# Patient Record
Sex: Male | Born: 1985 | Hispanic: Yes | Marital: Single | Smoking: Never smoker
Health system: Southern US, Community
[De-identification: ages and names within clinical notes are randomized; demographics above are authoritative.]

## PROBLEM LIST (undated history)

## (undated) HISTORY — PX: FOOT SURGERY: SHX648

---

## 2014-07-08 ENCOUNTER — Emergency Department: Payer: Self-pay | Admitting: Emergency Medicine

## 2014-07-08 LAB — URINALYSIS, COMPLETE
Bacteria: NONE SEEN
Bilirubin,UR: NEGATIVE
Blood: NEGATIVE
Glucose,UR: NEGATIVE mg/dL
Ketone: NEGATIVE
Leukocyte Esterase: NEGATIVE
Nitrite: NEGATIVE
Ph: 6
Protein: NEGATIVE
RBC,UR: 2 /HPF
Specific Gravity: 1.018
Squamous Epithelial: NONE SEEN
WBC UR: 1 /HPF

## 2016-12-07 ENCOUNTER — Encounter: Payer: Self-pay | Admitting: Emergency Medicine

## 2016-12-07 ENCOUNTER — Emergency Department
Admission: EM | Admit: 2016-12-07 | Discharge: 2016-12-07 | Disposition: A | Discharge status: Home / Self Care | Payer: 59 | Attending: Emergency Medicine | Admitting: Emergency Medicine

## 2016-12-07 DIAGNOSIS — G8929 Other chronic pain: Secondary | ICD-10-CM | POA: Diagnosis not present

## 2016-12-07 DIAGNOSIS — M545 Low back pain: Secondary | ICD-10-CM | POA: Insufficient documentation

## 2016-12-07 DIAGNOSIS — L245 Irritant contact dermatitis due to other chemical products: Secondary | ICD-10-CM | POA: Diagnosis not present

## 2016-12-07 DIAGNOSIS — R21 Rash and other nonspecific skin eruption: Secondary | ICD-10-CM | POA: Diagnosis present

## 2016-12-07 MED ORDER — CALAMINE EX LOTN: 1.0000 "application " | TOPICAL_LOTION | CUTANEOUS | 0 refills | Status: AC | PRN

## 2016-12-07 MED ORDER — IBUPROFEN 800 MG PO TABS: 800.0000 mg | ORAL_TABLET | Freq: Three times a day (TID) | ORAL | 0 refills | Status: DC | PRN

## 2016-12-07 MED ORDER — LORATADINE 10 MG PO TABS: 10.0000 mg | ORAL_TABLET | Freq: Every day | ORAL | 0 refills | Status: AC

## 2016-12-07 NOTE — ED Provider Notes (Signed)
[facility] Emergency Department Provider Note  ____________________________________________  Time seen: Approximately 11:41 AM  I have reviewed the triage vital signs and the nursing notes.   HISTORY  Chief Complaint Rash    HPI Edward Hensley is a 31 y.o. male that presents to the emergency department with a rash over his forearms and low back pain. Patient states that rash began after starting to use a new cleaning solution for floors at work. Patient states that he wears wrist length gloves. Cleaning solution hits the rest of forearm when he submerges hands in cleaning bucket.Patient states that other people at work have similar skin symptoms. Rash itches. He has taken benadryl but it makes him tired. Patient states that he also has low back pain in the center. Pain does not radiate and  is sharp. Patient has seen primary care for this before, who has given him narcotics. Patient states that they told him they could not continue narcotics. They did an x-ray, which did not show anything. No recent trauma. He was recommended to see a chiropractor regularly and has an appointment next week. Patient denies fever, SOB, CP, nausea, vomiting, bowel or bladder incontinence, saddle parathesias, numbness, tingling.   History reviewed. No pertinent past medical history.  There are no active problems to display for this patient.   History reviewed. No pertinent surgical history.  Prior to Admission medications   Medication Sig Start Date End Date Taking? Authorizing Provider  calamine lotion Apply 1 application topically as needed for itching. 12/07/16   Enid DerryAshley Shin Lamour, PA-C  ibuprofen (ADVIL,MOTRIN) 800 MG tablet Take 1 tablet (800 mg total) by mouth every 8 (eight) hours as needed. 12/07/16   Enid DerryAshley Eldon Zietlow, PA-C  loratadine (CLARITIN) 10 MG tablet Take 1 tablet (10 mg total) by mouth daily. 12/07/16 12/07/17  Enid DerryAshley Sukhraj Esquivias, PA-C    Allergies Patient has no known  allergies.  History reviewed. No pertinent family history.  Social History Social History  Substance Use Topics  . Smoking status: Never Smoker  . Smokeless tobacco: Never Used  . Alcohol use Yes     Review of Systems  Constitutional: No fever/chills ENT: No upper respiratory complaints. Cardiovascular: No chest pain. Respiratory: No cough. No SOB. Gastrointestinal: No abdominal pain.  No nausea, no vomiting.  Skin: Negative for rash, abrasions, lacerations, ecchymosis. Neurological: Negative for headaches, numbness or tingling   ____________________________________________   PHYSICAL EXAM:  VITAL SIGNS: ED Triage Vitals  Enc Vitals Group     BP 12/07/16 1112 124/77     Pulse Rate 12/07/16 1112 71     Resp 12/07/16 1112 16     Temp 12/07/16 1112 98.4 F (36.9 C)     Temp Source 12/07/16 1112 Oral     SpO2 12/07/16 1112 99 %     Weight 12/07/16 1108 170 lb (77.1 kg)     Height --      Head Circumference --      Peak Flow --      Pain Score --      Pain Loc --      Pain Edu? --      Excl. in GC? --     Eyes: Conjunctivae are normal. PERRL. EOMI. Head: Atraumatic. ENT:      Ears:      Nose: No congestion/rhinnorhea.      Mouth/Throat: Mucous membranes are moist.  Neck: No stridor.   Cardiovascular: Normal rate, regular rhythm.  Good peripheral circulation. Respiratory: Normal  respiratory effort without tachypnea or retractions. Lungs CTAB. Good air entry to the bases with no decreased or absent breath sounds. Musculoskeletal: Full range of motion to all extremities. No gross deformities appreciated. No tenderness to palpation over lumbar spine or SI joints. Neurologic:  Normal speech and language. No gross focal neurologic deficits are appreciated.  Skin:  Skin is warm, dry and intact. Dry, scaling skin over forearms bilaterally   ____________________________________________   LABS (all labs ordered are listed, but only abnormal results are  displayed)  Labs Reviewed - No data to display ____________________________________________  EKG   ____________________________________________  RADIOLOGY  No results found.  ____________________________________________    PROCEDURES  Procedure(s) performed:    Procedures    Medications - No data to display   ____________________________________________   INITIAL IMPRESSION / ASSESSMENT AND PLAN / ED COURSE  Pertinent labs & imaging results that were available during my care of the patient were reviewed by me and considered in my medical decision making (see chart for details).  Review of the [place] CSRS was performed in accordance of the [facility] prior to dispensing any controlled drugs.   Patient's diagnosis is consistent with irritant contact dermatitis and low back pain. Vital signs and exam are reassuring. Patient was given work note stating that he needs elbow length gloves and eye protection. Patient should apply calamine lotion to forearms and avoid exposure to floor cleaner. Patient should continue seeing chiropractor for low back pain and can take ibuprofen 3 times a day as needed. Patient that I will not prescribe narcotics. Extensive education was provided. Patient will be discharged home with prescriptions for calamine lotion, ibuprofen, claritin. Patient is to follow up with PCP as directed. Patient is given ED precautions to return to the ED for any worsening or new symptoms.     ____________________________________________  FINAL CLINICAL IMPRESSION(S) / ED DIAGNOSES  Final diagnoses:  Irritant contact dermatitis due to other chemical products  Chronic midline low back pain without sciatica      NEW MEDICATIONS STARTED DURING THIS VISIT:  Discharge Medication List as of 12/07/2016 11:49 AM    START taking these medications   Details  calamine lotion Apply 1 application topically as needed for itching., Starting Thu 12/07/2016, Print    ibuprofen  (ADVIL,MOTRIN) 800 MG tablet Take 1 tablet (800 mg total) by mouth every 8 (eight) hours as needed., Starting Thu 12/07/2016, Print    loratadine (CLARITIN) 10 MG tablet Take 1 tablet (10 mg total) by mouth daily., Starting Thu 12/07/2016, Until Fri 12/07/2017, Print            This chart was dictated using voice recognition software/Dragon. Despite best efforts to proofread, errors can occur which can change the meaning. Any change was purely unintentional.    Enid Derry, PA-C 12/07/16 1312    Arnaldo Natal, MD 12/07/16 [phone]

## 2016-12-07 NOTE — ED Notes (Signed)
States arm dryness for several weeks, pt states dryness began after he began using a new cleaner at work, states he wears short gloves and irritation is noticed from wrists down, awake and alert, interpreter at bedside

## 2016-12-07 NOTE — ED Triage Notes (Addendum)
Pt c/o rash X 1 month to arms.  Was to whole body but now only arms. Appears to have dry skin to arms. It itches per pt. NAD. Respirations unlabored. No fevers. He thinks from an additive at work used to wash hands.

## 2018-01-09 ENCOUNTER — Encounter: Payer: Self-pay | Admitting: Emergency Medicine

## 2018-01-09 ENCOUNTER — Emergency Department: Payer: 59

## 2018-01-09 ENCOUNTER — Other Ambulatory Visit: Payer: Self-pay

## 2018-01-09 ENCOUNTER — Emergency Department
Admission: EM | Admit: 2018-01-09 | Discharge: 2018-01-09 | Disposition: A | Discharge status: Home / Self Care | Payer: 59 | Attending: Emergency Medicine | Admitting: Emergency Medicine

## 2018-01-09 DIAGNOSIS — S40012A Contusion of left shoulder, initial encounter: Secondary | ICD-10-CM | POA: Diagnosis not present

## 2018-01-09 DIAGNOSIS — M25572 Pain in left ankle and joints of left foot: Secondary | ICD-10-CM | POA: Insufficient documentation

## 2018-01-09 DIAGNOSIS — Y999 Unspecified external cause status: Secondary | ICD-10-CM | POA: Diagnosis not present

## 2018-01-09 DIAGNOSIS — Z79899 Other long term (current) drug therapy: Secondary | ICD-10-CM | POA: Insufficient documentation

## 2018-01-09 DIAGNOSIS — Y939 Activity, unspecified: Secondary | ICD-10-CM | POA: Diagnosis not present

## 2018-01-09 DIAGNOSIS — Y929 Unspecified place or not applicable: Secondary | ICD-10-CM | POA: Diagnosis not present

## 2018-01-09 DIAGNOSIS — S299XXA Unspecified injury of thorax, initial encounter: Secondary | ICD-10-CM | POA: Diagnosis present

## 2018-01-09 DIAGNOSIS — S20212A Contusion of left front wall of thorax, initial encounter: Secondary | ICD-10-CM | POA: Diagnosis not present

## 2018-01-09 MED ORDER — CYCLOBENZAPRINE HCL 5 MG PO TABS: 5.0000 mg | ORAL_TABLET | Freq: Three times a day (TID) | ORAL | 0 refills | Status: AC | PRN

## 2018-01-09 MED ORDER — IBUPROFEN 800 MG PO TABS
800.0000 mg | ORAL_TABLET | Freq: Once | ORAL | Status: AC
  Administered 2018-01-09: 800 mg via ORAL
  Filled 2018-01-09: qty 1

## 2018-01-09 MED ORDER — IBUPROFEN 800 MG PO TABS
800.0000 mg | ORAL_TABLET | Freq: Three times a day (TID) | ORAL | 0 refills | Status: AC | PRN
Start: 2018-01-09 — End: ?

## 2018-01-09 MED ORDER — CYCLOBENZAPRINE HCL 10 MG PO TABS
10.0000 mg | ORAL_TABLET | Freq: Once | ORAL | Status: AC
  Administered 2018-01-09: 10 mg via ORAL
  Filled 2018-01-09: qty 1

## 2018-01-09 NOTE — ED Notes (Signed)
Pt returned from xray at this time.
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
## 2018-01-09 NOTE — Discharge Instructions (Signed)
Please rest ice and elevate the ankle shoulder and ribs.  Take Tylenol, ibuprofen and Flexeril as needed for pain.  Follow with orthopedics if no improvement in 1 week.
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
## 2018-01-09 NOTE — ED Notes (Signed)
Pt ambulatory to POV, VSS, NAD. Discharge instructions, RX and follow up discussed with pt with interpreter.

## 2018-01-09 NOTE — ED Notes (Signed)
Patient transported to X-ray 

## 2018-01-21 ENCOUNTER — Ambulatory Visit
Admission: RE | Admit: 2018-01-21 | Discharge: 2018-01-21 | Disposition: A | Discharge status: Home / Self Care | Payer: 59 | Source: Ambulatory Visit | Attending: Family Medicine | Admitting: Family Medicine

## 2018-01-21 ENCOUNTER — Other Ambulatory Visit: Payer: Self-pay | Admitting: Chiropractor

## 2018-01-21 ENCOUNTER — Ambulatory Visit
Admission: RE | Admit: 2018-01-21 | Discharge: 2018-01-21 | Disposition: A | Discharge status: Home / Self Care | Payer: 59 | Source: Ambulatory Visit | Attending: Chiropractor | Admitting: Chiropractor

## 2018-01-21 DIAGNOSIS — R52 Pain, unspecified: Secondary | ICD-10-CM | POA: Insufficient documentation

## 2018-01-21 DIAGNOSIS — M4184 Other forms of scoliosis, thoracic region: Secondary | ICD-10-CM | POA: Diagnosis not present
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
## 2019-05-05 ENCOUNTER — Other Ambulatory Visit: Payer: Self-pay

## 2019-05-05 DIAGNOSIS — Z20822 Contact with and (suspected) exposure to covid-19: Secondary | ICD-10-CM

## 2019-05-08 LAB — NOVEL CORONAVIRUS, NAA: SARS-CoV-2, NAA: NOT DETECTED

## 2019-05-09 ENCOUNTER — Telehealth: Payer: Self-pay | Admitting: General Practice

## 2019-05-09 NOTE — Telephone Encounter (Signed)
Pt would like to request a copy of Covid-19 results to be mailed to his home. Tried to explain MyChart but the language barrier even with interpreter, there was no understanding.  [address]  [place] [address]

## 2019-05-12 NOTE — Telephone Encounter (Signed)
Per pt. Request, mailed copy of COVID result to home address.

## 2019-07-22 ENCOUNTER — Other Ambulatory Visit: Payer: Self-pay

## 2019-07-22 DIAGNOSIS — Z20822 Contact with and (suspected) exposure to covid-19: Secondary | ICD-10-CM

## 2019-07-24 LAB — NOVEL CORONAVIRUS, NAA: SARS-CoV-2, NAA: DETECTED — AB

## 2020-02-14 ENCOUNTER — Ambulatory Visit: Payer: 59 | Attending: Internal Medicine

## 2020-02-14 DIAGNOSIS — Z23 Encounter for immunization: Secondary | ICD-10-CM

## 2020-02-14 NOTE — Progress Notes (Signed)
   Covid-19 Vaccination Clinic  Name:  Urie Loughner    MRN: [id number] DOB: 07/02/1986  02/14/2020  Mr. Tiffany Talarico was observed post Covid-19 immunization for 15 minutes without incident. He was provided with Vaccine Information Sheet and instruction to access the V-Safe system.   Mr. Darwyn Ponzo was instructed to call 911 with any severe reactions post vaccine: Marland Kitchen Difficulty breathing  . Swelling of face and throat  . A fast heartbeat  . A bad rash all over body  . Dizziness and weakness   Immunizations Administered    Name Date Dose VIS Date Route   Pfizer COVID-19 Vaccine 02/14/2020  1:04 PM 0.3 mL 12/03/2018 Intramuscular   Manufacturer: [employer], [employer]   Lot: [id number]   NDC: [address]-7

## 2020-03-13 ENCOUNTER — Ambulatory Visit: Payer: 59 | Attending: Internal Medicine

## 2021-04-16 ENCOUNTER — Other Ambulatory Visit: Payer: Self-pay

## 2021-04-16 ENCOUNTER — Emergency Department
Admission: EM | Admit: 2021-04-16 | Discharge: 2021-04-16 | Disposition: A | Discharge status: Home / Self Care | Payer: 59 | Attending: Emergency Medicine | Admitting: Emergency Medicine

## 2021-04-16 ENCOUNTER — Encounter: Payer: Self-pay | Admitting: Emergency Medicine

## 2021-04-16 DIAGNOSIS — L237 Allergic contact dermatitis due to plants, except food: Secondary | ICD-10-CM | POA: Insufficient documentation

## 2021-04-16 MED ORDER — DEXAMETHASONE SODIUM PHOSPHATE 10 MG/ML IJ SOLN
10.0000 mg | Freq: Once | INTRAMUSCULAR | Status: AC
  Administered 2021-04-16: 10 mg via INTRAVENOUS
  Filled 2021-04-16: qty 1

## 2021-04-16 MED ORDER — PREDNISONE 10 MG (48) PO TBPK: ORAL_TABLET | ORAL | 0 refills | Status: AC

## 2021-04-16 NOTE — ED Provider Notes (Signed)
[facility] Emergency Department Provider Note  ____________________________________________   Event Date/Time   First MD Initiated Contact with Patient 04/16/21 1336     (approximate)  I have reviewed the triage vital signs and the nursing notes.   HISTORY  Chief Complaint No chief complaint on file.    HPI Edward Hensley is a 35 y.o. male presents emergency department with poison ivy.  Patient states he was cutting down a tree which was covered in poison ivy.  States he also has a lot of poison ivy around his house.  States he has sprayed poison ivy but it keeps coming back.  No fever or chills.  No trouble breathing.  States he is just itching  History reviewed. No pertinent past medical history.  There are no problems to display for this patient.   Past Surgical History:  Procedure Laterality Date   FOOT SURGERY      Prior to Admission medications   Medication Sig Start Date End Date Taking? Authorizing Provider  predniSONE (STERAPRED UNI-PAK 48 TAB) 10 MG (48) TBPK tablet Take 6 pills for 2 days, 5 pills x 2d, 4 pills x 2d, 3 pills x 2d, 2 pills x 2d, 1 pill x 2d 04/16/21  Yes Marleigh Kaylor, Roselyn Bering, PA-C  calamine lotion Apply 1 application topically as needed for itching. 12/07/16   Enid Derry, PA-C  cyclobenzaprine (FLEXERIL) 5 MG tablet Take 1-2 tablets (5-10 mg total) by mouth 3 (three) times daily as needed for muscle spasms. 01/09/18   Evon Slack, PA-C  ibuprofen (ADVIL,MOTRIN) 800 MG tablet Take 1 tablet (800 mg total) by mouth every 8 (eight) hours as needed. 01/09/18   Evon Slack, PA-C  loratadine (CLARITIN) 10 MG tablet Take 1 tablet (10 mg total) by mouth daily. 12/07/16 12/07/17  Enid Derry, PA-C    Allergies Patient has no known allergies.  No family history on file.  Social History Social History   Tobacco Use   Smoking status: Never   Smokeless tobacco: Never  Substance Use Topics   Alcohol use: Yes   Drug  use: No    Review of Systems  Constitutional: No fever/chills Eyes: No visual changes. ENT: No sore throat. Respiratory: Denies cough Cardiovascular: Denies chest pain Gastrointestinal: Denies abdominal pain Genitourinary: Negative for dysuria. Musculoskeletal: Negative for back pain. Skin: Positive for rash. Psychiatric: no mood changes,     ____________________________________________   PHYSICAL EXAM:  VITAL SIGNS: ED Triage Vitals  Enc Vitals Group     BP 04/16/21 1323 130/84     Pulse Rate 04/16/21 1323 80     Resp 04/16/21 1323 20     Temp 04/16/21 1323 98.4 F (36.9 C)     Temp Source 04/16/21 1323 Oral     SpO2 04/16/21 1323 96 %     Weight 04/16/21 1319 172 lb (78 kg)     Height 04/16/21 1319 5\' 2"  (1.575 m)     Head Circumference --      Peak Flow --      Pain Score 04/16/21 1318 0     Pain Loc --      Pain Edu? --      Excl. in GC? --     Constitutional: Alert and oriented. Well appearing and in no acute distress. Eyes: Conjunctivae are normal.  Head: Atraumatic. Nose: No congestion/rhinnorhea. Mouth/Throat: Mucous membranes are moist.   Neck:  supple no lymphadenopathy noted Cardiovascular: Normal rate, regular rhythm.  Respiratory: Normal respiratory effort.  No retractions GU: deferred Musculoskeletal: FROM all extremities, warm and well perfused Neurologic:  Normal speech and language.  Skin:  Skin is warm, dry and intact.  Contact dermatitis type rash noted on the patient's arms legs and chest, some on the neck and face Psychiatric: Mood and affect are normal. Speech and behavior are normal.  ____________________________________________   LABS (all labs ordered are listed, but only abnormal results are displayed)  Labs Reviewed - No data to display ____________________________________________   ____________________________________________  RADIOLOGY    ____________________________________________   PROCEDURES  Procedure(s)  performed: Decadron 10 mg IM   Procedures    ____________________________________________   INITIAL IMPRESSION / ASSESSMENT AND PLAN / ED COURSE  Pertinent labs & imaging results that were available during my care of the patient were reviewed by me and considered in my medical decision making (see chart for details).   The patient is 35 year old male presents emergency department poison ivy.  See HPI.  Physical exam is consistent with same.  Patient was given a Decadron 10 mg injection here in the ED.  He is to start taking Sterapred tomorrow.  Return emergency department worsening.  Advised him that he will need to respray his yard frequently to kill off the poison ivy.  He can also try taking an over-the-counter Ivy ease pill which will help prevent some of the breakouts when exposed to poison ivy.  States he understands.  Discharged in stable condition.     Edward Hensley was evaluated in Emergency Department on 04/16/2021 for the symptoms described in the history of present illness. He was evaluated in the context of the global COVID-19 pandemic, which necessitated consideration that the patient might be at risk for infection with the SARS-CoV-2 virus that causes COVID-19. Institutional protocols and algorithms that pertain to the evaluation of patients at risk for COVID-19 are in a state of rapid change based on information released by regulatory bodies including the CDC and federal and state organizations. These policies and algorithms were followed during the patient's care in the ED.    As part of my medical decision making, I reviewed the following data within the electronic MEDICAL RECORD NUMBER Nursing notes reviewed and incorporated, Interpreter needed, Old chart reviewed, Notes from prior ED visits, and [place] Controlled Substance Database  ____________________________________________   FINAL CLINICAL IMPRESSION(S) / ED DIAGNOSES  Final diagnoses:  Allergic dermatitis due to  poison ivy      NEW MEDICATIONS STARTED DURING THIS VISIT:  New Prescriptions   PREDNISONE (STERAPRED UNI-PAK 48 TAB) 10 MG (48) TBPK TABLET    Take 6 pills for 2 days, 5 pills x 2d, 4 pills x 2d, 3 pills x 2d, 2 pills x 2d, 1 pill x 2d     Note:  This document was prepared using Dragon voice recognition software and may include unintentional dictation errors.    Faythe Ghee, PA-C 04/16/21 1339    Phineas Semen, MD 04/16/21 [phone]

## 2021-04-16 NOTE — Discharge Instructions (Signed)
Use roundup on the poison ivy in your yard, you will have to spray every time it reappears  Take over the counter ivy ease pills, this will help you not get the rash

## 2021-04-16 NOTE — ED Triage Notes (Signed)
Pt reports that he chopped down a tree and he noticed that he developed a rash all over his body. He believes that it poison Ivy. He developed the rash 2 days ago, it is spreading more.
